# Patient Record
Sex: Male | Born: 1980 | Race: Black or African American | Hispanic: No | Marital: Single | Smoking: Never smoker
Health system: Southern US, Community
[De-identification: ages and names within clinical notes are randomized; demographics above are authoritative.]

---

## 2018-10-04 ENCOUNTER — Emergency Department (HOSPITAL_COMMUNITY)
Admission: EM | Admit: 2018-10-04 | Discharge: 2018-10-04 | Disposition: A | Discharge status: Home / Self Care | Payer: Self-pay | Attending: Emergency Medicine | Admitting: Emergency Medicine

## 2018-10-04 ENCOUNTER — Emergency Department (HOSPITAL_COMMUNITY): Payer: Self-pay

## 2018-10-04 ENCOUNTER — Encounter (HOSPITAL_COMMUNITY): Payer: Self-pay | Admitting: Emergency Medicine

## 2018-10-04 DIAGNOSIS — Y939 Activity, unspecified: Secondary | ICD-10-CM | POA: Insufficient documentation

## 2018-10-04 DIAGNOSIS — Y999 Unspecified external cause status: Secondary | ICD-10-CM | POA: Insufficient documentation

## 2018-10-04 DIAGNOSIS — S0181XA Laceration without foreign body of other part of head, initial encounter: Secondary | ICD-10-CM | POA: Insufficient documentation

## 2018-10-04 DIAGNOSIS — Z23 Encounter for immunization: Secondary | ICD-10-CM | POA: Insufficient documentation

## 2018-10-04 DIAGNOSIS — R51 Headache: Secondary | ICD-10-CM | POA: Insufficient documentation

## 2018-10-04 DIAGNOSIS — S21111A Laceration without foreign body of right front wall of thorax without penetration into thoracic cavity, initial encounter: Secondary | ICD-10-CM | POA: Insufficient documentation

## 2018-10-04 DIAGNOSIS — S1083XA Contusion of other specified part of neck, initial encounter: Secondary | ICD-10-CM | POA: Insufficient documentation

## 2018-10-04 DIAGNOSIS — Y92009 Unspecified place in unspecified non-institutional (private) residence as the place of occurrence of the external cause: Secondary | ICD-10-CM | POA: Insufficient documentation

## 2018-10-04 DIAGNOSIS — S1181XA Laceration without foreign body of other specified part of neck, initial encounter: Secondary | ICD-10-CM | POA: Insufficient documentation

## 2018-10-04 DIAGNOSIS — S01312A Laceration without foreign body of left ear, initial encounter: Secondary | ICD-10-CM | POA: Insufficient documentation

## 2018-10-04 DIAGNOSIS — S21211A Laceration without foreign body of right back wall of thorax without penetration into thoracic cavity, initial encounter: Secondary | ICD-10-CM | POA: Insufficient documentation

## 2018-10-04 DIAGNOSIS — T07XXXA Unspecified multiple injuries, initial encounter: Secondary | ICD-10-CM

## 2018-10-04 LAB — CBC WITH DIFFERENTIAL/PLATELET
Abs Immature Granulocytes: 0.04 10*3/uL (ref 0.00–0.07)
Basophils Absolute: 0 10*3/uL (ref 0.0–0.1)
Basophils Relative: 0 %
Eosinophils Absolute: 0.2 10*3/uL (ref 0.0–0.5)
Eosinophils Relative: 3 %
HCT: 47.8 % (ref 39.0–52.0)
Hemoglobin: 14.9 g/dL (ref 13.0–17.0)
Immature Granulocytes: 1 %
Lymphocytes Relative: 40 %
Lymphs Abs: 2.9 10*3/uL (ref 0.7–4.0)
MCH: 26.7 pg (ref 26.0–34.0)
MCHC: 31.2 g/dL (ref 30.0–36.0)
MCV: 85.5 fL (ref 80.0–100.0)
Monocytes Absolute: 0.6 10*3/uL (ref 0.1–1.0)
Monocytes Relative: 8 %
Neutro Abs: 3.6 10*3/uL (ref 1.7–7.7)
Neutrophils Relative %: 48 %
Platelets: 200 10*3/uL (ref 150–400)
RBC: 5.59 MIL/uL (ref 4.22–5.81)
RDW: 13.2 % (ref 11.5–15.5)
WBC: 7.3 10*3/uL (ref 4.0–10.5)
nRBC: 0 % (ref 0.0–0.2)

## 2018-10-04 LAB — BASIC METABOLIC PANEL
Anion gap: 8 (ref 5–15)
BUN: 13 mg/dL (ref 6–20)
CO2: 27 mmol/L (ref 22–32)
Calcium: 9.1 mg/dL (ref 8.9–10.3)
Chloride: 105 mmol/L (ref 98–111)
Creatinine, Ser: 1.39 mg/dL — ABNORMAL HIGH (ref 0.61–1.24)
GFR calc Af Amer: >60 mL/min (ref >60)
GFR calc non Af Amer: >60 mL/min (ref >60)
Glucose, Bld: 121 mg/dL — ABNORMAL HIGH (ref 70–99)
Potassium: 3.8 mmol/L (ref 3.5–5.1)
Sodium: 140 mmol/L (ref 135–145)

## 2018-10-04 MED ORDER — IOHEXOL 300 MG/ML  SOLN
75.0000 mL | Freq: Once | INTRAMUSCULAR | Status: AC | PRN
  Administered 2018-10-04: 04:00:00 75 mL via INTRAVENOUS

## 2018-10-04 MED ORDER — TETANUS-DIPHTH-ACELL PERTUSSIS 5-2.5-18.5 LF-MCG/0.5 IM SUSP
0.5000 mL | Freq: Once | INTRAMUSCULAR | Status: AC
  Administered 2018-10-04: 03:00:00 0.5 mL via INTRAMUSCULAR
  Filled 2018-10-04: qty 0.5

## 2018-10-04 MED ORDER — MORPHINE SULFATE (PF) 4 MG/ML IV SOLN
4.0000 mg | Freq: Once | INTRAVENOUS | Status: AC
  Administered 2018-10-04: 4 mg via INTRAVENOUS
  Filled 2018-10-04: qty 1

## 2018-10-04 MED ORDER — LIDOCAINE HCL (PF) 1 % IJ SOLN
INTRAMUSCULAR | Status: AC
  Administered 2018-10-04: 05:00:00
  Filled 2018-10-04: qty 30

## 2018-10-04 MED ORDER — LIDOCAINE-EPINEPHRINE (PF) 2 %-1:200000 IJ SOLN
20.0000 mL | Freq: Once | INTRAMUSCULAR | Status: AC
  Administered 2018-10-04: 20 mL
  Filled 2018-10-04: qty 20

## 2018-10-04 MED ORDER — IBUPROFEN 600 MG PO TABS: 600.0000 mg | ORAL_TABLET | Freq: Four times a day (QID) | ORAL | 0 refills | Status: AC | PRN

## 2018-10-04 MED ORDER — ONDANSETRON HCL 4 MG/2ML IJ SOLN
4.0000 mg | Freq: Once | INTRAMUSCULAR | Status: AC
  Administered 2018-10-04: 4 mg via INTRAVENOUS
  Filled 2018-10-04: qty 2
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
## 2018-10-04 NOTE — ED Provider Notes (Signed)
LACERATION REPAIR Performed by: Arnoldo HookerShari A Kenneshia Rehm Authorized by: Arnoldo HookerShari A Kristofor Michalowski Consent: Verbal consent obtained. Risks and benefits: risks, benefits and alternatives were discussed Consent given by: patient Patient identity confirmed: provided demographic data Prepped and Draped in normal sterile fashion Wound explored  Laceration Location: left temple   Laceration Length: 2.5 cm  No Foreign Bodies seen or palpated  Anesthesia: local infiltration  Local anesthetic: lidocaine 2% w/epinephrine  Anesthetic total: 2 ml  Irrigation method: syringe Amount of cleaning: standard  Skin closure: 5-0 fast absorbing     Number of sutures: 5  Technique: running  Patient tolerance: Patient tolerated the procedure well with no immediate complications.  LACERATION REPAIR Performed by: Arnoldo HookerShari A Cariah Salatino Authorized by: Arnoldo HookerShari A Darrion Macaulay Consent: Verbal consent obtained. Risks and benefits: risks, benefits and alternatives were discussed Consent given by: patient Patient identity confirmed: provided demographic data Prepped and Draped in normal sterile fashion Wound explored  Laceration Location: left posterior neck  Laceration Length: 2cm  No Foreign Bodies seen or palpated  Anesthesia: local infiltration  Local anesthetic: lidocaine 2% w/epinephrine  Anesthetic total: 2 ml  Irrigation method: syringe Amount of cleaning: standard  Skin closure: staple  Number of sutures: 3  Technique: stable  Patient tolerance: Patient tolerated the procedure well with no immediate complications.  LACERATION REPAIR Performed by: Arnoldo HookerShari A Jury Caserta Authorized by: Arnoldo HookerShari A Bethannie Iglehart Consent: Verbal consent obtained. Risks and benefits: risks, benefits and alternatives were discussed Consent given by: patient Patient identity confirmed: provided demographic data Prepped and Draped in normal sterile fashion Wound explored  Laceration Location: right posterior neck  Laceration Length:  2cm  No Foreign Bodies seen or palpated  Anesthesia: local infiltration  Local anesthetic: lidocaine 2% w/epinephrine  Anesthetic total: 2 ml  Irrigation method: syringe Amount of cleaning: standard  Skin closure: staple  Number of sutures: 3  Technique: staple  Patient tolerance: Patient tolerated the procedure well with no immediate complications.  LACERATION REPAIR Performed by: Arnoldo HookerShari A Milayna Rotenberg Authorized by: Arnoldo HookerShari A Dandria Griego Consent: Verbal consent obtained. Risks and benefits: risks, benefits and alternatives were discussed Consent given by: patient Patient identity confirmed: provided demographic data Prepped and Draped in normal sterile fashion Wound explored  Laceration Location: left ear helix  Laceration Length: 3.5cm  No Foreign Bodies seen or palpated  Anesthesia: local infiltration  Local anesthetic: lidocaine 1% w/o epinephrine  Anesthetic total: 3 ml  Irrigation method: syringe Amount of cleaning: standard  Skin closure: 5-0 fast absorbing  Number of sutures: 7  Technique: simple interrupted  Patient tolerance: Patient tolerated the procedure well with no immediate complications.   LACERATION REPAIR Performed by: Arnoldo HookerShari A Faris Coolman Authorized by: Arnoldo HookerShari A Keeghan Mcintire Consent: Verbal consent obtained. Risks and benefits: risks, benefits and alternatives were discussed Consent given by: patient Patient identity confirmed: provided demographic data Prepped and Draped in normal sterile fashion Wound explored  Laceration Location: sternal chest  Laceration Length: 2cm  No Foreign Bodies seen or palpated  Anesthesia: local infiltration  Local anesthetic: lidocaine 2% w/epinephrine  Anesthetic total: 2 ml  Irrigation method: syringe Amount of cleaning: standard  Skin closure: staple  Number of sutures: 2  Technique: staple  Patient tolerance: Patient tolerated the procedure well with no immediate complications.   LACERATION  REPAIR Performed by: Arnoldo HookerShari A Ajanee Buren Authorized by: Arnoldo HookerShari A Fatima Fedie Consent: Verbal consent obtained. Risks and benefits: risks, benefits and alternatives were discussed Consent given by: patient Patient identity confirmed: provided demographic data Prepped and Draped in normal sterile fashion  Wound explored  Laceration Location: right scapular back  Laceration Length: 2cm  No Foreign Bodies seen or palpated  Anesthesia: local infiltration  Local anesthetic: lidocaine 2% w/epinephrine  Anesthetic total: 2 ml  Irrigation method: syringe Amount of cleaning: standard  Skin closure: staple  Number of sutures: 2  Technique: staple  Patient tolerance: Patient tolerated the procedure well with no immediate complications.      Elpidio Anis, PA-C 10/04/18 [id number]    Shon Baton, MD 10/04/18 [phone]
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
## 2018-10-04 NOTE — ED Notes (Signed)
Patient currently at CT scan .  
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
## 2021-05-04 ENCOUNTER — Emergency Department (HOSPITAL_COMMUNITY)
Admission: EM | Admit: 2021-05-04 | Discharge: 2021-05-04 | Disposition: A | Discharge status: Home / Self Care | Payer: Self-pay | Attending: Emergency Medicine | Admitting: Emergency Medicine

## 2021-05-04 DIAGNOSIS — K047 Periapical abscess without sinus: Secondary | ICD-10-CM | POA: Insufficient documentation

## 2021-05-04 MED ORDER — IBUPROFEN 800 MG PO TABS
800.0000 mg | ORAL_TABLET | Freq: Once | ORAL | Status: AC
  Administered 2021-05-04: 800 mg via ORAL
  Filled 2021-05-04: qty 1

## 2021-05-04 MED ORDER — AMOXICILLIN-POT CLAVULANATE 875-125 MG PO TABS
1.0000 | ORAL_TABLET | Freq: Once | ORAL | Status: AC
  Administered 2021-05-04: 1 via ORAL
  Filled 2021-05-04: qty 1

## 2021-05-04 MED ORDER — AMOXICILLIN-POT CLAVULANATE 875-125 MG PO TABS: 1.0000 | ORAL_TABLET | Freq: Two times a day (BID) | ORAL | 0 refills | Status: AC

## 2021-05-04 MED ORDER — BENZOCAINE 10 % MT GEL: 1.0000 "application " | OROMUCOSAL | 0 refills | Status: AC | PRN

## 2021-05-04 NOTE — ED Provider Notes (Signed)
[facility]-EMERGENCY DEPT Provider Note   CSN: [id number] Arrival date & time: 05/04/21  0841     History Chief Complaint  Patient presents with   Dental Pain    Caleb Rivera is a 40 y.o. male who presents with 2 days of left-sided jaw pain.  History of extraction of broken tooth while in prison.  Does endorse left-sided facial swelling but denies any fevers or chills, nausea, or vomiting.  Denies difficulty swallowing or breathing.  History of dental infection in the past.  Minimal improvement with Tylenol.  No further work-up warranted in the ER at this time I personally read this patient's medical records.  He does not carry medical diagnoses and is not any medications every day.  HPI     No past medical history on file.  There are no problems to display for this patient.   No past surgical history on file.     No family history on file.  Social History   Tobacco Use   Smoking status: Never   Smokeless tobacco: Never  Substance Use Topics   Drug use: Not Currently    Home Medications Prior to Admission medications   Medication Sig Start Date End Date Taking? Authorizing Provider  amoxicillin-clavulanate (AUGMENTIN) 875-125 MG tablet Take 1 tablet by mouth every 12 (twelve) hours. 05/04/21  Yes Leila Schuff R, PA-C  benzocaine (ORAJEL) 10 % mucosal gel Use as directed 1 application in the mouth or throat as needed for mouth pain. 05/04/21  Yes Keilin Gamboa R, PA-C  ibuprofen (ADVIL,MOTRIN) 600 MG tablet Take 1 tablet (600 mg total) by mouth every 6 (six) hours as needed. 10/04/18   Horton, Mayer Masker, MD    Allergies    Patient has no known allergies.  Review of Systems   Review of Systems  Constitutional: Negative.   HENT:  Positive for dental problem and facial swelling. Negative for drooling, ear discharge, ear pain, sneezing, sore throat, tinnitus, trouble swallowing and voice change.   Eyes: Negative.   Respiratory:  Negative.    Cardiovascular: Negative.   Gastrointestinal: Negative.   Genitourinary: Negative.   Musculoskeletal: Negative.   Skin: Negative.   Neurological: Negative.   Hematological: Negative.   Psychiatric/Behavioral: Negative.     Physical Exam Updated Vital Signs BP (!) 158/91 (BP Location: Right Arm)   Pulse 84   Temp 98.6 F (37 C) (Oral)   Resp 16   Ht 5\' 10"  (1.778 m)   Wt 87.5 kg   SpO2 100%   BMI 27.69 kg/m   Physical Exam Vitals and nursing note reviewed.  Constitutional:      Appearance: He is not ill-appearing or toxic-appearing.  HENT:     Head: Normocephalic and atraumatic.     Nose: Nose normal. No congestion.     Mouth/Throat:     Mouth: Mucous membranes are moist.     Dentition: Abnormal dentition. Dental tenderness, gingival swelling and dental caries present. No dental abscesses.     Pharynx: Oropharynx is clear. Uvula midline. No oropharyngeal exudate or posterior oropharyngeal erythema.     Tonsils: No tonsillar exudate.      Comments: No sublingual or submental tenderness to palpation. Eyes:     General: Lids are normal. Vision grossly intact.        Right eye: No discharge.        Left eye: No discharge.     Extraocular Movements: Extraocular movements intact.     Conjunctiva/sclera: Conjunctivae normal.  Pupils: Pupils are equal, round, and reactive to light.  Neck:     Trachea: Trachea and phonation normal.  Cardiovascular:     Rate and Rhythm: Normal rate and regular rhythm.     Pulses: Normal pulses.     Heart sounds: Normal heart sounds. No murmur heard. Pulmonary:     Effort: Pulmonary effort is normal. No respiratory distress.     Breath sounds: Normal breath sounds.  Abdominal:     General: Bowel sounds are normal. There is no distension.     Palpations: Abdomen is soft.     Tenderness: There is no abdominal tenderness.  Musculoskeletal:        General: No deformity.     Cervical back: Normal range of motion and neck  supple. No edema, rigidity, tenderness or crepitus. No pain with movement, spinous process tenderness or muscular tenderness.  Lymphadenopathy:     Cervical: No cervical adenopathy.  Skin:    General: Skin is warm and dry.     Capillary Refill: Capillary refill takes less than 2 seconds.  Neurological:     General: No focal deficit present.     Mental Status: He is alert and oriented to person, place, and time. Mental status is at baseline.  Psychiatric:        Mood and Affect: Mood normal.    ED Results / Procedures / Treatments   Labs (all labs ordered are listed, but only abnormal results are displayed) Labs Reviewed - No data to display  EKG None  Radiology No results found.  Procedures Procedures   Medications Ordered in ED Medications  amoxicillin-clavulanate (AUGMENTIN) 875-125 MG per tablet 1 tablet (1 tablet Oral Given 05/04/21 1017)  ibuprofen (ADVIL) tablet 800 mg (800 mg Oral Given 05/04/21 1017)    ED Course  I have reviewed the triage vital signs and the nursing notes.  Pertinent labs & imaging results that were available during my care of the patient were reviewed by me and considered in my medical decision making (see chart for details).    MDM Rules/Calculators/A&P                         40 year old male presents with 2 days of dental pain.  Differential diagnosis includes but is not limited to periapical infection, dental abscess, peritonsillar abscess, retropharyngeal abscess, Ludwick's angina.  Hypertensive on intake, vital signs are normal.  Cardiopulmonary exam is normal, abdominal exam is benign.  Oral exam as above with periapical infection in the left posterior gumline at the remnants of extracted molars.  No sublingual or mental tenderness palpation, no anterior neck crepitus or tracheal deviation.  First dose of antibiotics administered in the ER for periapical infection.  With a history of antibiotic and Orajel prescription.  May Tylenol and  improvement as needed.  No further work-up warranted in the ER at this time.  Dental resources provided.  [name] voiced understanding with medical evaluation and treatment plan.  She was questions was answered to his expressed satisfaction.  Return precautions were given.  Patient is well-appearing, stable, and appropriate for discharge at this time.  This chart was dictated using voice recognition software, Dragon. Despite the best efforts of this provider to proofread and correct errors, errors may still occur which can change documentation meaning.  Final Clinical Impression(s) / ED Diagnoses Final diagnoses:  Dental infection    Rx / DC Orders ED Discharge Orders  Ordered    amoxicillin-clavulanate (AUGMENTIN) 875-125 MG tablet  Every 12 hours        05/04/21 0916    benzocaine (ORAJEL) 10 % mucosal gel  As needed        05/04/21 0918             Jeannett Dekoning, Eugene Gavia, PA-C 05/04/21 1115    Lorre Nick, MD 05/06/21 1552

## 2021-05-04 NOTE — ED Triage Notes (Signed)
Patient reports jaw swelling and pain left side, started last night. Pain 10/10

## 2021-05-04 NOTE — Discharge Instructions (Addendum)
You are seen in the ER today for your dental pain.  You were diagnosed with dental infection. Please take entire course of antibiotics as directed.  Continue using ibuprofen / Tylenol, as well as prescribed Orajel for pain.  You will need to follow-up with your dentist for continued management of this. Please see dental resources below. Return to the emergency department for fevers, swelling or pain under the tongue or in the neck, difficulty breathing or swallowing, nausea or vomiting that does not stop, or any other new or concerning symptoms.

## 2022-05-04 ENCOUNTER — Other Ambulatory Visit: Payer: Self-pay

## 2022-05-04 ENCOUNTER — Emergency Department (HOSPITAL_COMMUNITY): Payer: Self-pay

## 2022-05-04 ENCOUNTER — Emergency Department (HOSPITAL_COMMUNITY)
Admission: EM | Admit: 2022-05-04 | Discharge: 2022-05-04 | Disposition: A | Discharge status: Home / Self Care | Payer: Self-pay | Attending: Emergency Medicine | Admitting: Emergency Medicine

## 2022-05-04 DIAGNOSIS — M545 Low back pain, unspecified: Secondary | ICD-10-CM | POA: Insufficient documentation

## 2022-05-04 MED ORDER — METHOCARBAMOL 500 MG PO TABS: 500.0000 mg | ORAL_TABLET | Freq: Two times a day (BID) | ORAL | 0 refills | Status: AC

## 2022-05-04 MED ORDER — LIDOCAINE 5 % EX PTCH: 1.0000 | MEDICATED_PATCH | CUTANEOUS | 0 refills | Status: AC

## 2022-05-04 NOTE — ED Provider Notes (Signed)
[facility] DEPT Provider Note   CSN: [id number] Arrival date & time: 05/04/22  1723     History  Chief Complaint  Patient presents with   Back Pain    Abel Hageman is a 41 y.o. male who presents to the ED complaining of left lower back pain onset 4 days.  Notes that he injured his back again today.  No meds tried prior to arrival.  Notes he has had his back evaluated before in [place] for similar concerns.  Denies numbness, tingling, weakness, abdominal pain, nausea, vomiting.   The history is provided by the patient. No language interpreter was used.       Home Medications Prior to Admission medications   Medication Sig Start Date End Date Taking? Authorizing Provider  lidocaine (LIDODERM) 5 % Place 1 patch onto the skin daily. Remove & Discard patch within 12 hours or as directed by MD 05/04/22  Yes Lateka Rady A, PA-C  methocarbamol (ROBAXIN) 500 MG tablet Take 1 tablet (500 mg total) by mouth 2 (two) times daily. 05/04/22  Yes Celene Pippins A, PA-C  amoxicillin-clavulanate (AUGMENTIN) 875-125 MG tablet Take 1 tablet by mouth every 12 (twelve) hours. 05/04/21   Sponseller, Gypsy Balsam, PA-C  benzocaine (ORAJEL) 10 % mucosal gel Use as directed 1 application in the mouth or throat as needed for mouth pain. 05/04/21   Sponseller, Gypsy Balsam, PA-C  ibuprofen (ADVIL,MOTRIN) 600 MG tablet Take 1 tablet (600 mg total) by mouth every 6 (six) hours as needed. 10/04/18   Horton, Barbette Hair, MD      Allergies    Patient has no known allergies.    Review of Systems   Review of Systems  Musculoskeletal:  Positive for back pain.  All other systems reviewed and are negative.   Physical Exam Updated Vital Signs BP 114/72 (BP Location: Right Arm)   Pulse 89   Temp 98.5 F (36.9 C) (Oral)   Resp 17   SpO2 95%  Physical Exam Vitals and nursing note reviewed.  Constitutional:      General: He is not in acute distress.    Appearance: He is not  diaphoretic.  HENT:     Head: Normocephalic and atraumatic.     Mouth/Throat:     Pharynx: No oropharyngeal exudate.  Eyes:     General: No scleral icterus.    Conjunctiva/sclera: Conjunctivae normal.  Cardiovascular:     Rate and Rhythm: Normal rate and regular rhythm.     Pulses: Normal pulses.     Heart sounds: Normal heart sounds.  Pulmonary:     Effort: Pulmonary effort is normal. No respiratory distress.     Breath sounds: Normal breath sounds. No wheezing.  Abdominal:     General: Bowel sounds are normal.     Palpations: Abdomen is soft. There is no mass.     Tenderness: There is no abdominal tenderness. There is no guarding or rebound.  Musculoskeletal:        General: Normal range of motion.     Cervical back: Normal range of motion and neck supple.     Comments: Able to ambulate without assistance or difficulty.  Strength and sensation intact to bilateral lower extremities.  Positive straight leg raise on the left.  No spinal tenderness to palpation.  Tenderness to palpation noted to left lumbar musculature.  Skin:    General: Skin is warm and dry.  Neurological:     Mental Status: He is alert.  Psychiatric:  Behavior: Behavior normal.     ED Results / Procedures / Treatments   Labs (all labs ordered are listed, but only abnormal results are displayed) Labs Reviewed - No data to display  EKG None  Radiology DG Lumbar Spine Complete  Result Date: 05/04/2022 CLINICAL DATA:  Left-sided back pain. EXAM: LUMBAR SPINE - COMPLETE 4+ VIEW COMPARISON:  None Available. FINDINGS: There is no evidence of lumbar spine fracture. Alignment is normal. Intervertebral disc spaces are maintained. Negative for pars defect. IMPRESSION: Negative lumbar spine. Electronically Signed   By: Richarda Overlie M.D.   On: 05/04/2022 18:39    Procedures Procedures    Medications Ordered in ED Medications - No data to display  ED Course/ Medical Decision Making/ A&P                            Medical Decision Making Amount and/or Complexity of Data Reviewed Radiology: ordered.  Risk Prescription drug management.   Patient with left lumbar back pain with no radiation. No neurological deficits and normal neuro exam.  Patient is ambulatory without assistance or difficulty.  No loss of bowel or bladder control.  No concern for cauda equina.  No fever, night sweats, weight loss, h/o cancer, IVDA, no recent procedure to back. No urinary symptoms suggestive of UTI.  Vital signs, patient afebrile. On exam, patient with strength and sensation intact to bilateral lower extremities.  Positive straight leg raise on the left.  No spinal tenderness to palpation.  Tenderness to palpation noted to left lumbar musculature. No concerning findings on exams. Differential diagnosis includes but is not limited to fracture, herniation, muscle strain, sciatica.   Imaging: I ordered imaging studies including lumbar x-ray I independently visualized and interpreted imaging which showed: No acute findings I agree with the radiologist interpretation  Disposition: Presentation suspicious for left lower back pain without sciatica.  Doubt fracture, dislocation, herniation at this time. After consideration of the diagnostic results and the patients response to treatment, I feel that the patient would benefit from Discharge home. Work note provided. Will send patient home with prescription for Robaxin and Lidoderm patch.  Discussed importance of follow-up with primary care provider for management.  Supportive care measures and strict return precautions discussed with patient at bedside. Pt acknowledges and verbalizes understanding. Pt appears safe for discharge. Follow up as indicated in discharge paperwork.    This chart was dictated using voice recognition software, Dragon. Despite the best efforts of this provider to proofread and correct errors, errors may still occur which can change documentation  meaning.   Final Clinical Impression(s) / ED Diagnoses Final diagnoses:  Acute left-sided low back pain without sciatica    Rx / DC Orders ED Discharge Orders          Ordered    methocarbamol (ROBAXIN) 500 MG tablet  2 times daily        05/04/22 2243    lidocaine (LIDODERM) 5 %  Every 24 hours        05/04/22 2243              Aalayah Riles A, PA-C 05/04/22 2311    Terrilee Files, MD 05/05/22 1042

## 2022-05-04 NOTE — ED Triage Notes (Signed)
Pt reports left sided back pain radiating down left leg. Pt seen in ER on Friday for the same.  Denies urinary s/sy, hematuria, abd pain

## 2022-05-04 NOTE — ED Notes (Signed)
Patient verbalizes understanding of discharge instructions. Opportunity for questioning and answers were provided. Armband removed by staff, pt discharged from ED. Ambulated out to lobby  

## 2022-05-04 NOTE — Discharge Instructions (Addendum)
It was a pleasure taking care of you today!   Your x-ray was negative for fracture.  You will be sent a prescription for lidocaine patch, remove and replace with a new patch every 12 hours.  You will be sent a prescription called Robaxin (muscle relaxer). Take medications as prescribed.  Do not operate any heavy machinery or drive while taking Robaxin.  You may apply ice or heat to the affected area for up to 15 minutes at a time.  Attached is information for the on-call orthopedist, you may call and set up a follow-up appointment regarding today's ED visit.  Ensure to place a barrier between your skin and the ice or heat. Return to the Emergency Department if you are experiencing increasing/worsening symptoms.

## 2022-05-04 NOTE — ED Provider Triage Note (Signed)
Emergency Medicine Provider Triage Evaluation Note  Caleb Rivera , a 41 y.o. male  was evaluated in triage.  Pt complains of left lower back pain onset 4 days.  Notes that he injured his back again today.  No meds tried prior to arrival.  Notes he has had his back evaluated before in [place] for similar concerns.  Denies numbness, tingling, weakness, abdominal pain, nausea, vomiting.  Review of Systems  Positive:  Negative:   Physical Exam  BP 114/72 (BP Location: Right Arm)   Pulse 89   Temp 98.5 F (36.9 C) (Oral)   Resp 17   SpO2 95%  Gen:   Awake, no distress  Resp:  Normal effort  MSK:   Moves extremities without difficulty  Other:  Able to ambulate without assistance or difficulty.  Strength and sensation intact to bilateral lower extremities.  Positive straight leg raise on the left.  No spinal tenderness to palpation.  Tenderness to palpation noted to left lumbar musculature.  Medical Decision Making  Medically screening exam initiated at 5:55 PM.  Appropriate orders placed.  Caleb Rivera was informed that the remainder of the evaluation will be completed by another provider, this initial triage assessment does not replace that evaluation, and the importance of remaining in the ED until their evaluation is complete.  Work-up initiated   Idamae Coccia A, PA-C 05/04/22 1756
